# Patient Record
Sex: Female | Born: 1955 | Race: White | Hispanic: No
Health system: Southern US, Community
[De-identification: ages and names within clinical notes are randomized; demographics above are authoritative.]

## PROBLEM LIST (undated history)

## (undated) DIAGNOSIS — R002 Palpitations: Secondary | ICD-10-CM

## (undated) DIAGNOSIS — E785 Hyperlipidemia, unspecified: Secondary | ICD-10-CM

## (undated) DIAGNOSIS — I10 Essential (primary) hypertension: Secondary | ICD-10-CM

## (undated) HISTORY — PX: ENDOMETRIAL ABLATION: SHX621

## (undated) HISTORY — DX: Hyperlipidemia, unspecified: E78.5

## (undated) HISTORY — PX: OTHER SURGICAL HISTORY: SHX169

## (undated) HISTORY — DX: Essential (primary) hypertension: I10

## (undated) HISTORY — DX: Palpitations: R00.2

---

## 2005-01-12 ENCOUNTER — Other Ambulatory Visit: Admission: RE | Admit: 2005-01-12 | Discharge: 2005-01-12 | Payer: Self-pay | Admitting: Obstetrics and Gynecology

## 2011-02-16 ENCOUNTER — Other Ambulatory Visit: Payer: Self-pay | Admitting: Obstetrics and Gynecology

## 2011-02-16 DIAGNOSIS — N644 Mastodynia: Secondary | ICD-10-CM

## 2011-02-22 ENCOUNTER — Ambulatory Visit
Admission: RE | Admit: 2011-02-22 | Discharge: 2011-02-22 | Disposition: A | Discharge status: Home / Self Care | Payer: PRIVATE HEALTH INSURANCE | Source: Ambulatory Visit | Attending: Obstetrics and Gynecology | Admitting: Obstetrics and Gynecology

## 2011-02-22 DIAGNOSIS — N644 Mastodynia: Secondary | ICD-10-CM

## 2012-03-14 ENCOUNTER — Other Ambulatory Visit: Payer: Self-pay | Admitting: Obstetrics and Gynecology

## 2012-03-14 DIAGNOSIS — Z1231 Encounter for screening mammogram for malignant neoplasm of breast: Secondary | ICD-10-CM

## 2012-03-15 ENCOUNTER — Ambulatory Visit
Admission: RE | Admit: 2012-03-15 | Discharge: 2012-03-15 | Disposition: A | Discharge status: Home / Self Care | Payer: PRIVATE HEALTH INSURANCE | Source: Ambulatory Visit | Attending: Obstetrics and Gynecology | Admitting: Obstetrics and Gynecology

## 2012-03-15 DIAGNOSIS — Z1231 Encounter for screening mammogram for malignant neoplasm of breast: Secondary | ICD-10-CM

## 2012-03-20 ENCOUNTER — Ambulatory Visit: Payer: PRIVATE HEALTH INSURANCE

## 2012-04-02 ENCOUNTER — Ambulatory Visit: Payer: PRIVATE HEALTH INSURANCE | Admitting: Cardiology

## 2012-04-03 ENCOUNTER — Ambulatory Visit: Payer: PRIVATE HEALTH INSURANCE | Admitting: Cardiology

## 2012-04-07 DIAGNOSIS — I10 Essential (primary) hypertension: Secondary | ICD-10-CM

## 2012-04-08 DIAGNOSIS — R42 Dizziness and giddiness: Secondary | ICD-10-CM

## 2012-04-08 DIAGNOSIS — R079 Chest pain, unspecified: Secondary | ICD-10-CM

## 2012-04-17 ENCOUNTER — Encounter: Payer: Self-pay | Admitting: Physician Assistant

## 2012-04-17 ENCOUNTER — Ambulatory Visit (INDEPENDENT_AMBULATORY_CARE_PROVIDER_SITE_OTHER): Payer: PRIVATE HEALTH INSURANCE | Admitting: Physician Assistant

## 2012-04-17 VITALS — BP 123/82 | HR 80 | Ht 68.0 in | Wt 226.0 lb

## 2012-04-17 DIAGNOSIS — I10 Essential (primary) hypertension: Secondary | ICD-10-CM

## 2012-04-17 DIAGNOSIS — R002 Palpitations: Secondary | ICD-10-CM

## 2012-04-17 MED ORDER — LISINOPRIL 20 MG PO TABS: 20.0000 mg | ORAL_TABLET | Freq: Every morning | ORAL | Status: AC

## 2012-04-17 NOTE — Assessment & Plan Note (Addendum)
No further cardiac workup is indicated. Patient was just recently briefly hospitalized here at [facility], and seen in consultation by Dr. Antoine Poche for evaluation of atypical CP, tachycardia palpitations, and hypertensive urgency. She underwent extensive evaluation, notable for normal electrolytes, troponins, and TSH level. She had a negative GXT, and 2-D echocardiogram indicated normal LVF (EF 55-60%), with normal wall motion, and evidence of diastolic dysfunction. Moreover, she demonstrated no obvious dysrhythmia or ectopy on continuous monitoring, despite the frequent nature of these "fluttering" episodes. Therefore, I suspect that the etiology for these symptoms is most likely musculoskeletal in nature, i.e. muscle spasm. Continued monitoring on a outpatient basis would not yield any additional data, and the patient concurred with this recommendation. We will have the patient return to this clinic to followup with Dr. Antoine Poche, on an as-needed basis.

## 2012-04-17 NOTE — Patient Instructions (Addendum)
   Change Lisinopril to 20mg  every morning  Continue all other current medications.   Follow up with [facility] regarding blood pressure  Follow up as needed

## 2012-04-17 NOTE — Progress Notes (Signed)
Primary Cardiologist: Rollene Rotunda, MD   HPI: Patient presents for post hospital followup from [facility]. She was seen in consultation by Dr. Antoine Poche, on September 8, for evaluation of dizziness and hypertensive urgency.  She presented with no prior history of heart disease. She also complained of atypical chest pain, and ruled out for MI with normal troponins. She was further evaluated with a routine GXT and a 2-D echocardiogram, both within normal limits.  Patient also presented with complaint of palpitations, for which she was originally scheduled to see [place] here in the clinic on this date. She had no documented dysrhythmias during her brief stay. EKG indicated NSR with incomplete RBBB. TSH level was normal.  Clinically, she continues to experience these very brief (4-5 second) episodes of "fluttering", underlying her right breast. They are unpredictable in onset, and can occur every 5 minutes, or so. She has been experiencing these on a daily basis, since July. Interestingly, she notes no change in her pulse during these episodes, or the sensation that her heart is racing.  Patient did present with newly diagnosed HTN, and was discharged on ACE inhibitor with instructions to monitor her BP at home. These data were reviewed today, and notable for systolic readings generally in the 110-130 range, with an occasional 160 reading.   Current Outpatient Prescriptions  Medication Sig Dispense Refill  . Calcium Carbonate-Vitamin D (CALCIUM-D) 600-400 MG-UNIT TABS Take 1 tablet by mouth 2 (two) times daily.      Marland Kitchen lisinopril (PRINIVIL,ZESTRIL) 20 MG tablet Take 1 tablet (20 mg total) by mouth every morning.  30 tablet  3  . vitamin E 400 UNIT capsule Take 400 Units by mouth 2 (two) times daily.      Marland Kitchen DISCONTD: lisinopril (PRINIVIL,ZESTRIL) 10 MG tablet Take 10 mg by mouth 2 (two) times daily.        Past Medical History  Diagnosis Date  . HTN (hypertension)   . HLD (hyperlipidemia)   . Palpitations      Past Surgical History  Procedure Date  . Bilateral tubal ligation   . Cesarean section (aka c-section)   . Endometrial ablation     History   Social History  . Marital Status: Married    Spouse Name: N/A    Number of Children: N/A  . Years of Education: N/A   Occupational History  . Not on file.   Social History Main Topics  . Smoking status: Not on file  . Smokeless tobacco: Not on file  . Alcohol Use:   . Drug Use:   . Sexually Active:    Other Topics Concern  . Not on file   Social History Narrative  . No narrative on file    Family History  Problem Relation Age of Onset  . Heart disease Father     ROS: no nausea, vomiting; no fever, chills; no melena, hematochezia; no claudication  PHYSICAL EXAM: BP 123/82  Pulse 80  Ht 5\' 8"  (1.727 m)  Wt 226 lb (102.513 kg)  BMI 34.36 kg/m2  SpO2 97% GENERAL: 56 year old female, obese; NAD HEENT: NCAT, PERRLA, EOMI; sclera clear; no xanthelasma NECK: palpable bilateral carotid pulses, no bruits; no JVD; no TM LUNGS: CTA bilaterally CARDIAC: RRR (S1, S2); no significant murmurs; no rubs or gallops ABDOMEN: Protuberant EXTREMETIES: Trace bilateral peripheral edema SKIN: warm/dry; no obvious rash/lesions MUSCULOSKELETAL: no joint deformity NEURO: no focal deficit; NL affect   EKG:    ASSESSMENT & PLAN:  Palpitations No further cardiac workup is indicated.  Patient was just recently briefly hospitalized here at [facility], and seen in consultation by Dr. Antoine Poche for evaluation of atypical CP, tachycardia palpitations, and hypertensive urgency. She underwent extensive evaluation, notable for normal electrolytes, troponins, and TSH level. She had a negative GXT, and 2-D echocardiogram indicated normal LVF (EF 55-60%), with normal wall motion, and evidence of diastolic dysfunction. Moreover, she demonstrated no obvious dysrhythmia or ectopy on continuous monitoring, despite the frequent nature of these "fluttering"  episodes. Therefore, I suspect that the etiology for these symptoms is most likely musculoskeletal in nature, i.e. muscle spasm. Continued monitoring on a outpatient basis would not yield any additional data, and the patient concurred with this recommendation. We will have the patient return to this clinic to followup with Dr. Antoine Poche, on an as-needed basis.  HTN (hypertension) We'll consolidate lisinopril to 20 mg every morning, per patient request. Patient is to continue to monitor her BP closely in the ambulatory setting, and to resume followup with her primary care team in [place], [place].    Gene Shunte Senseney, PAC

## 2012-04-17 NOTE — Assessment & Plan Note (Signed)
We'll consolidate lisinopril to 20 mg every morning, per patient request. Patient is to continue to monitor her BP closely in the ambulatory setting, and to resume followup with her primary care team in [place], [place].

## 2013-05-30 ENCOUNTER — Other Ambulatory Visit: Payer: Self-pay

## 2013-05-30 DIAGNOSIS — Z1231 Encounter for screening mammogram for malignant neoplasm of breast: Secondary | ICD-10-CM

## 2013-06-25 ENCOUNTER — Ambulatory Visit
Admission: RE | Admit: 2013-06-25 | Discharge: 2013-06-25 | Disposition: A | Discharge status: Home / Self Care | Payer: PRIVATE HEALTH INSURANCE | Source: Ambulatory Visit

## 2013-06-25 DIAGNOSIS — Z1231 Encounter for screening mammogram for malignant neoplasm of breast: Secondary | ICD-10-CM

## 2014-08-13 ENCOUNTER — Other Ambulatory Visit: Payer: Self-pay

## 2014-08-13 DIAGNOSIS — Z1231 Encounter for screening mammogram for malignant neoplasm of breast: Secondary | ICD-10-CM

## 2014-08-18 ENCOUNTER — Ambulatory Visit
Admission: RE | Admit: 2014-08-18 | Discharge: 2014-08-18 | Disposition: A | Discharge status: Home / Self Care | Payer: BC Managed Care – PPO | Source: Ambulatory Visit

## 2014-08-18 DIAGNOSIS — Z1231 Encounter for screening mammogram for malignant neoplasm of breast: Secondary | ICD-10-CM

## 2015-06-03 ENCOUNTER — Encounter (INDEPENDENT_AMBULATORY_CARE_PROVIDER_SITE_OTHER): Payer: Self-pay | Admitting: *Deleted

## 2015-07-07 ENCOUNTER — Ambulatory Visit (INDEPENDENT_AMBULATORY_CARE_PROVIDER_SITE_OTHER): Payer: BC Managed Care – PPO | Admitting: Internal Medicine

## 2016-03-13 ENCOUNTER — Other Ambulatory Visit: Payer: Self-pay | Admitting: Obstetrics and Gynecology

## 2016-03-13 DIAGNOSIS — Z1231 Encounter for screening mammogram for malignant neoplasm of breast: Secondary | ICD-10-CM

## 2016-03-24 ENCOUNTER — Ambulatory Visit
Admission: RE | Admit: 2016-03-24 | Discharge: 2016-03-24 | Disposition: A | Discharge status: Home / Self Care | Payer: BC Managed Care – PPO | Source: Ambulatory Visit | Attending: Obstetrics and Gynecology | Admitting: Obstetrics and Gynecology

## 2016-03-24 DIAGNOSIS — Z1231 Encounter for screening mammogram for malignant neoplasm of breast: Secondary | ICD-10-CM

## 2017-06-05 ENCOUNTER — Other Ambulatory Visit: Payer: Self-pay | Admitting: Radiology

## 2017-06-05 ENCOUNTER — Ambulatory Visit
Admission: RE | Admit: 2017-06-05 | Discharge: 2017-06-05 | Disposition: A | Discharge status: Home / Self Care | Payer: BC Managed Care – PPO | Source: Ambulatory Visit | Attending: Radiology | Admitting: Radiology

## 2017-06-05 DIAGNOSIS — Z1231 Encounter for screening mammogram for malignant neoplasm of breast: Secondary | ICD-10-CM

## 2017-07-31 HISTORY — PX: BREAST BIOPSY: SHX20

## 2018-05-02 ENCOUNTER — Other Ambulatory Visit: Payer: Self-pay | Admitting: Radiology

## 2018-05-02 ENCOUNTER — Other Ambulatory Visit: Payer: Self-pay | Admitting: Obstetrics and Gynecology

## 2018-05-02 DIAGNOSIS — Z1231 Encounter for screening mammogram for malignant neoplasm of breast: Secondary | ICD-10-CM

## 2018-06-06 ENCOUNTER — Ambulatory Visit
Admission: RE | Admit: 2018-06-06 | Discharge: 2018-06-06 | Disposition: A | Discharge status: Home / Self Care | Payer: BC Managed Care – PPO | Source: Ambulatory Visit | Attending: Obstetrics and Gynecology | Admitting: Obstetrics and Gynecology

## 2018-06-06 DIAGNOSIS — Z1231 Encounter for screening mammogram for malignant neoplasm of breast: Secondary | ICD-10-CM

## 2018-06-10 ENCOUNTER — Other Ambulatory Visit: Payer: Self-pay | Admitting: Obstetrics and Gynecology

## 2018-06-10 DIAGNOSIS — R928 Other abnormal and inconclusive findings on diagnostic imaging of breast: Secondary | ICD-10-CM

## 2018-06-11 ENCOUNTER — Ambulatory Visit
Admission: RE | Admit: 2018-06-11 | Discharge: 2018-06-11 | Disposition: A | Discharge status: Home / Self Care | Payer: BC Managed Care – PPO | Source: Ambulatory Visit | Attending: Obstetrics and Gynecology | Admitting: Obstetrics and Gynecology

## 2018-06-11 ENCOUNTER — Other Ambulatory Visit: Payer: Self-pay | Admitting: Obstetrics and Gynecology

## 2018-06-11 DIAGNOSIS — R928 Other abnormal and inconclusive findings on diagnostic imaging of breast: Secondary | ICD-10-CM

## 2018-06-11 DIAGNOSIS — N6489 Other specified disorders of breast: Secondary | ICD-10-CM

## 2018-06-12 ENCOUNTER — Ambulatory Visit
Admission: RE | Admit: 2018-06-12 | Discharge: 2018-06-12 | Disposition: A | Discharge status: Home / Self Care | Payer: BC Managed Care – PPO | Source: Ambulatory Visit | Attending: Obstetrics and Gynecology | Admitting: Obstetrics and Gynecology

## 2018-06-12 DIAGNOSIS — N6489 Other specified disorders of breast: Secondary | ICD-10-CM

## 2018-11-26 ENCOUNTER — Other Ambulatory Visit: Payer: Self-pay | Admitting: Obstetrics and Gynecology

## 2018-11-26 DIAGNOSIS — N6489 Other specified disorders of breast: Secondary | ICD-10-CM

## 2018-12-12 ENCOUNTER — Other Ambulatory Visit: Payer: Self-pay

## 2018-12-12 ENCOUNTER — Ambulatory Visit: Payer: BC Managed Care – PPO

## 2018-12-12 ENCOUNTER — Ambulatory Visit
Admission: RE | Admit: 2018-12-12 | Discharge: 2018-12-12 | Disposition: A | Discharge status: Home / Self Care | Payer: BC Managed Care – PPO | Source: Ambulatory Visit | Attending: Obstetrics and Gynecology | Admitting: Obstetrics and Gynecology

## 2018-12-12 DIAGNOSIS — N6489 Other specified disorders of breast: Secondary | ICD-10-CM

## 2019-05-07 ENCOUNTER — Other Ambulatory Visit: Payer: Self-pay | Admitting: Obstetrics and Gynecology

## 2019-05-07 DIAGNOSIS — Z1231 Encounter for screening mammogram for malignant neoplasm of breast: Secondary | ICD-10-CM

## 2019-06-18 ENCOUNTER — Other Ambulatory Visit: Payer: Self-pay

## 2019-06-18 DIAGNOSIS — Z20822 Contact with and (suspected) exposure to covid-19: Secondary | ICD-10-CM

## 2019-06-19 ENCOUNTER — Ambulatory Visit: Payer: BC Managed Care – PPO

## 2019-06-20 ENCOUNTER — Ambulatory Visit: Payer: Self-pay

## 2019-06-20 LAB — NOVEL CORONAVIRUS, NAA: SARS-CoV-2, NAA: NOT DETECTED

## 2019-06-20 NOTE — Telephone Encounter (Signed)
Provided covid-19 testing.  Results to Patient .  Voiced understanding.

## 2019-08-08 ENCOUNTER — Ambulatory Visit
Admission: RE | Admit: 2019-08-08 | Discharge: 2019-08-08 | Disposition: A | Discharge status: Home / Self Care | Payer: BC Managed Care – PPO | Source: Ambulatory Visit | Attending: Obstetrics and Gynecology | Admitting: Obstetrics and Gynecology

## 2019-08-08 ENCOUNTER — Other Ambulatory Visit: Payer: Self-pay

## 2019-08-08 DIAGNOSIS — Z1231 Encounter for screening mammogram for malignant neoplasm of breast: Secondary | ICD-10-CM

## 2020-09-06 ENCOUNTER — Other Ambulatory Visit: Payer: Self-pay | Admitting: Obstetrics and Gynecology

## 2020-09-06 DIAGNOSIS — Z1231 Encounter for screening mammogram for malignant neoplasm of breast: Secondary | ICD-10-CM

## 2020-09-07 ENCOUNTER — Other Ambulatory Visit: Payer: Self-pay

## 2020-09-07 ENCOUNTER — Ambulatory Visit
Admission: RE | Admit: 2020-09-07 | Discharge: 2020-09-07 | Disposition: A | Discharge status: Home / Self Care | Payer: Medicare Other | Source: Ambulatory Visit | Attending: Obstetrics and Gynecology | Admitting: Obstetrics and Gynecology

## 2020-09-07 DIAGNOSIS — Z1231 Encounter for screening mammogram for malignant neoplasm of breast: Secondary | ICD-10-CM

## 2021-10-17 ENCOUNTER — Other Ambulatory Visit: Payer: Self-pay | Admitting: Obstetrics and Gynecology

## 2021-10-17 DIAGNOSIS — Z1231 Encounter for screening mammogram for malignant neoplasm of breast: Secondary | ICD-10-CM

## 2021-11-02 ENCOUNTER — Ambulatory Visit
Admission: RE | Admit: 2021-11-02 | Discharge: 2021-11-02 | Disposition: A | Discharge status: Home / Self Care | Payer: Medicare PPO | Source: Ambulatory Visit | Attending: Obstetrics and Gynecology | Admitting: Obstetrics and Gynecology

## 2021-11-02 DIAGNOSIS — Z1231 Encounter for screening mammogram for malignant neoplasm of breast: Secondary | ICD-10-CM

## 2022-02-09 ENCOUNTER — Other Ambulatory Visit: Payer: Self-pay | Admitting: Obstetrics and Gynecology

## 2022-02-09 DIAGNOSIS — Z1239 Encounter for other screening for malignant neoplasm of breast: Secondary | ICD-10-CM

## 2022-05-02 ENCOUNTER — Ambulatory Visit
Admission: RE | Admit: 2022-05-02 | Discharge: 2022-05-02 | Disposition: A | Discharge status: Home / Self Care | Payer: Medicare PPO | Source: Ambulatory Visit | Attending: Obstetrics and Gynecology | Admitting: Obstetrics and Gynecology

## 2022-05-02 DIAGNOSIS — Z1239 Encounter for other screening for malignant neoplasm of breast: Secondary | ICD-10-CM

## 2022-05-02 MED ORDER — GADOPICLENOL 0.5 MMOL/ML IV SOLN
10.0000 mL | Freq: Once | INTRAVENOUS | Status: AC | PRN
  Administered 2022-05-02: 10 mL via INTRAVENOUS

## 2022-11-08 IMAGING — MG MM DIGITAL SCREENING BILAT W/ TOMO AND CAD
6 of 11 series · 6 of 31 positions shown · non-contrast
Comparison: Previous exam(s).

CLINICAL DATA: Screening.

EXAM:
DIGITAL SCREENING BILATERAL MAMMOGRAM WITH TOMOSYNTHESIS AND CAD
TECHNIQUE: Bilateral screening digital craniocaudal and mediolateral oblique
mammograms were obtained. Bilateral screening digital breast
tomosynthesis was performed. The images were evaluated with
computer-aided detection.

[R MLO synth-2D]
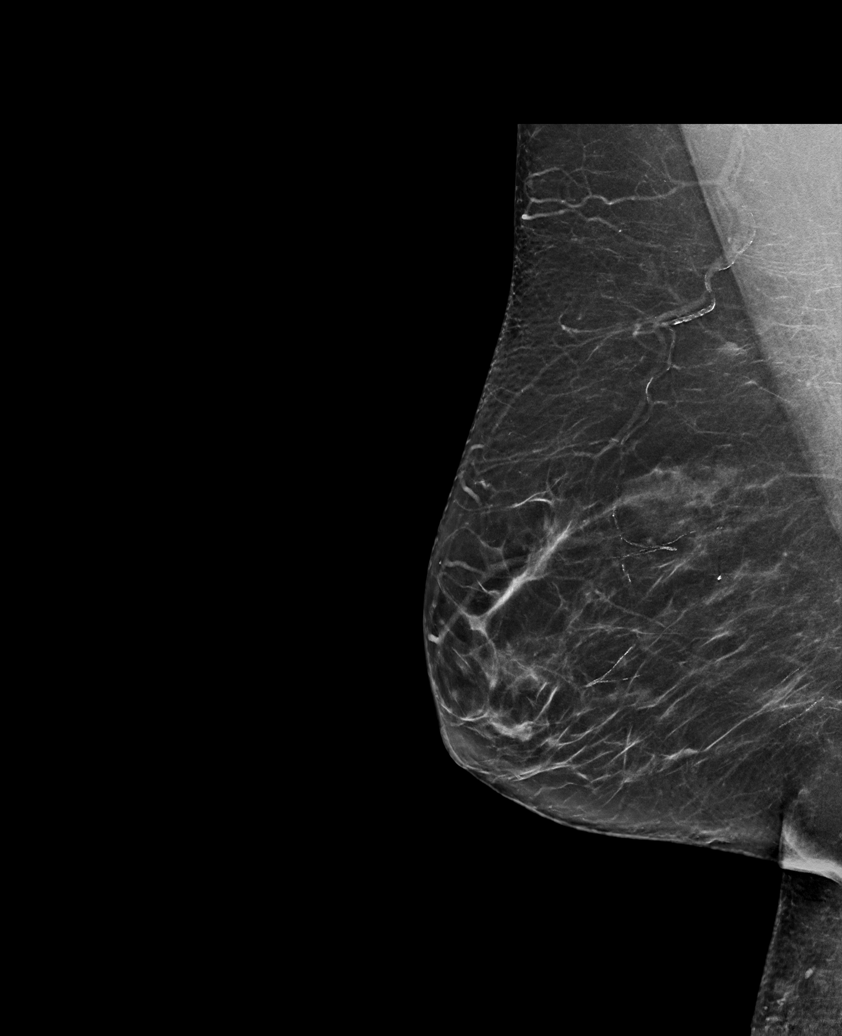

[R CC synth-2D]
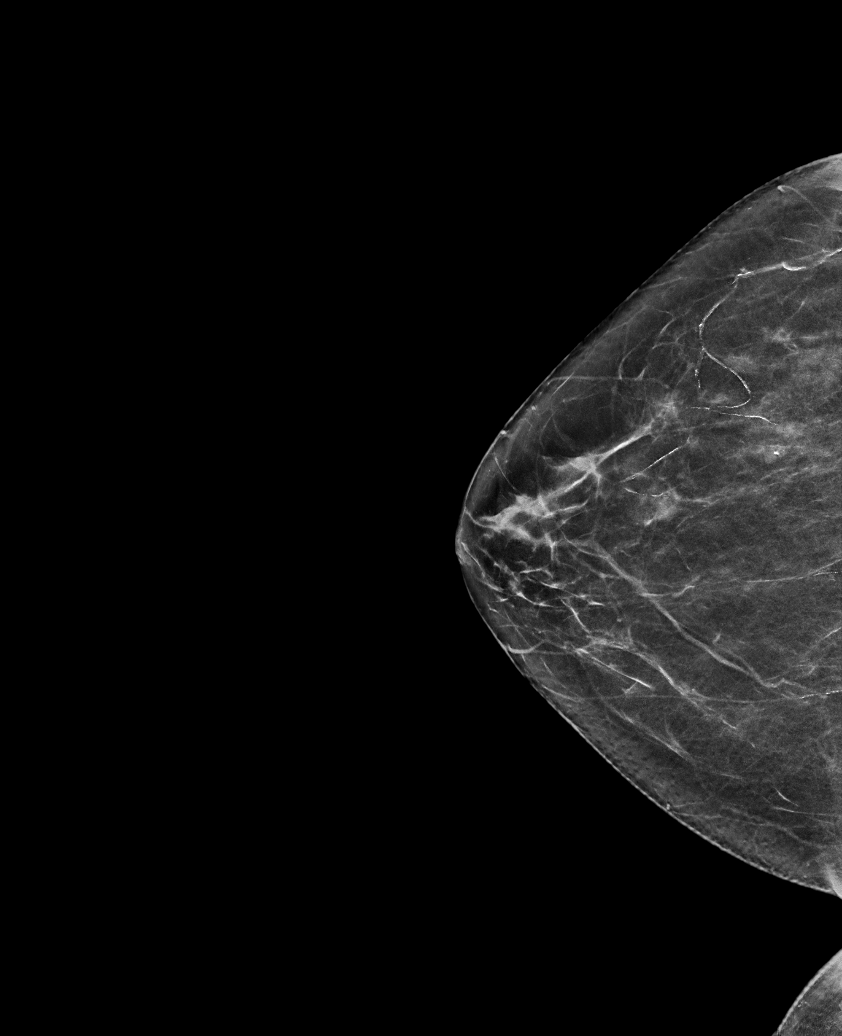

[L MLO synth-2D (1 of 2)]
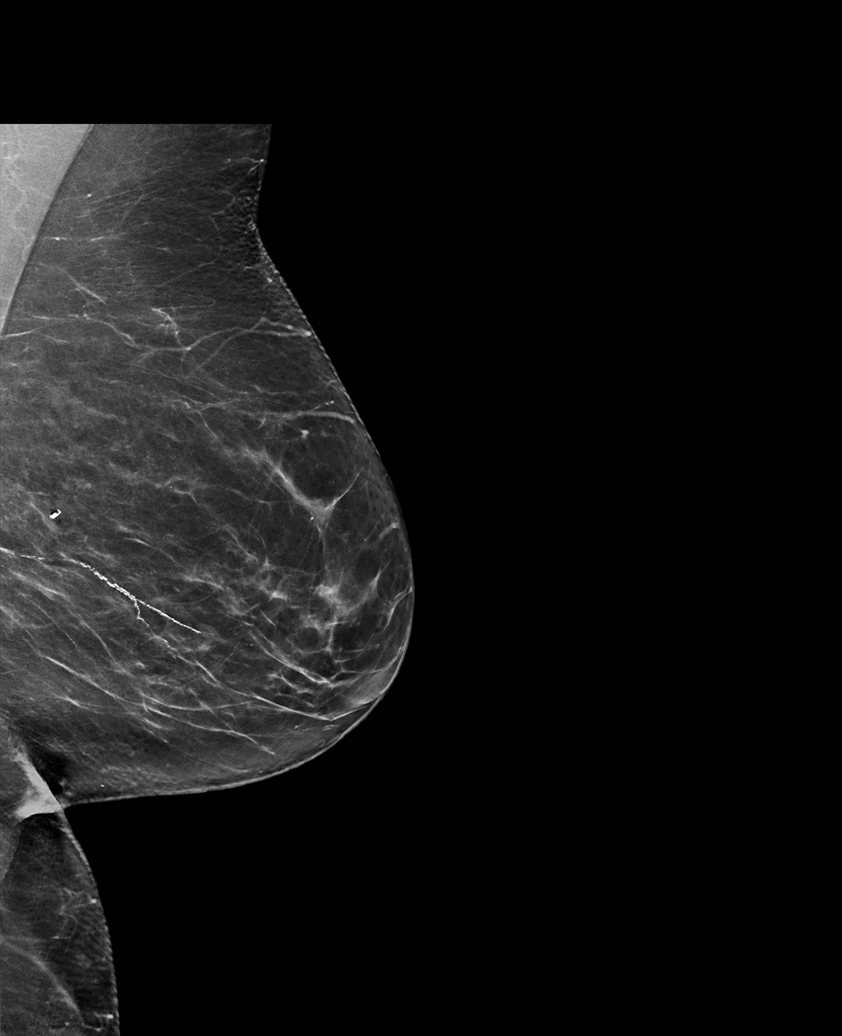

[L MLO synth-2D (2 of 2)]
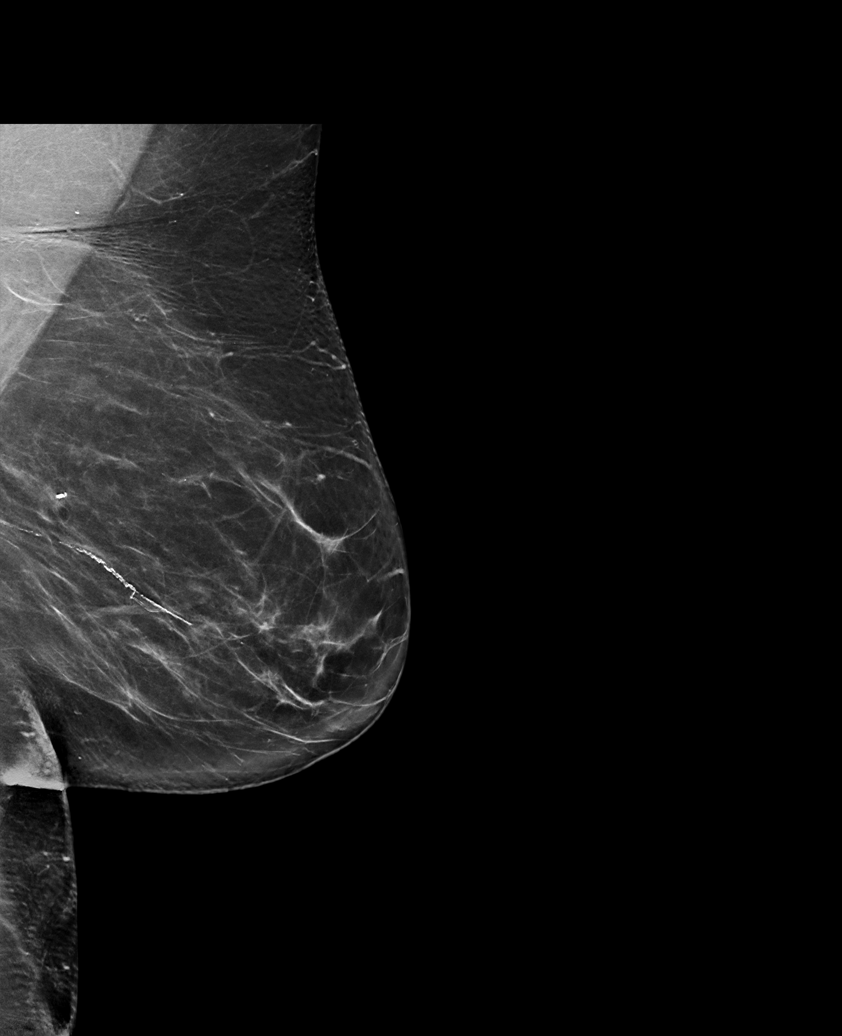

[L CC synth-2D]
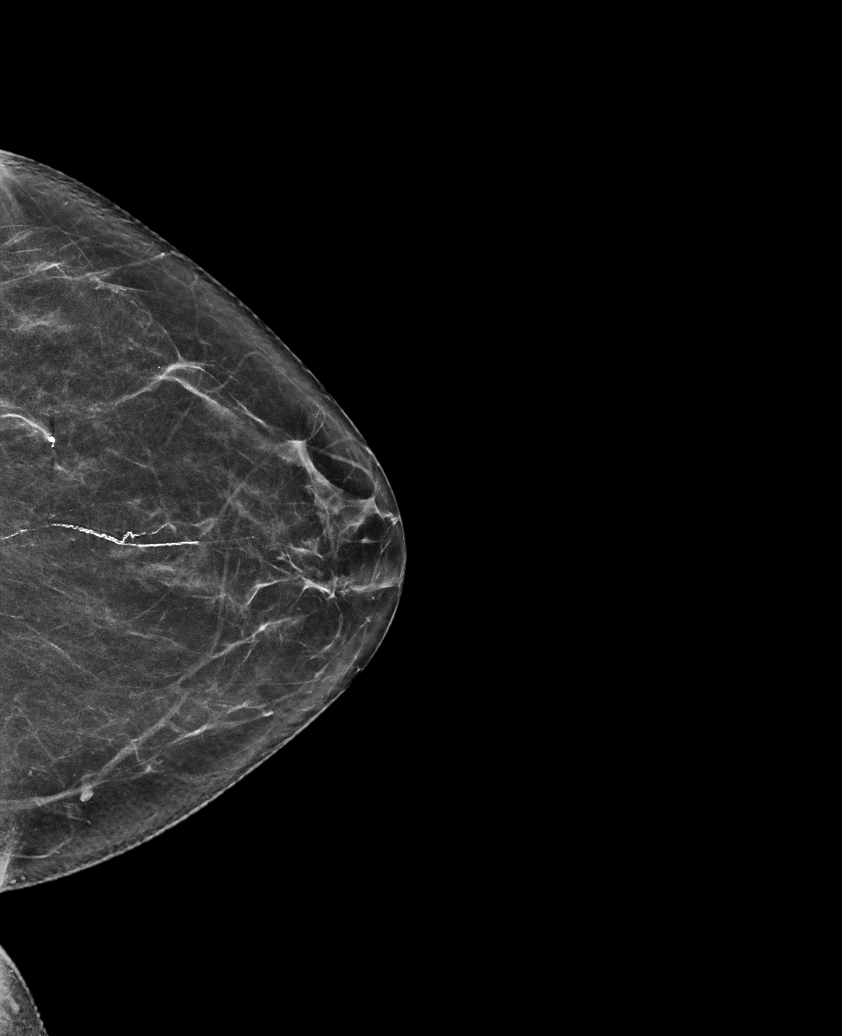

[L MLO tomo]
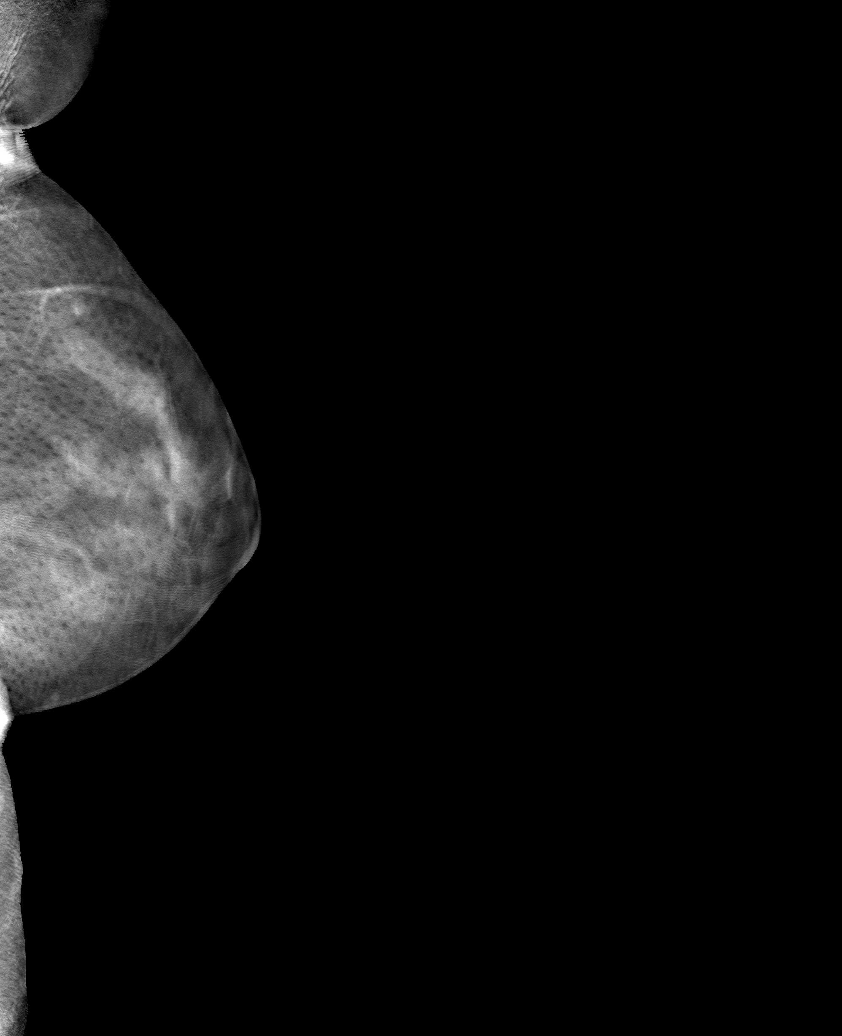

[6 of 31 positions shown; findings below may reference images not displayed]

ACR Breast Density Category b: There are scattered areas of
fibroglandular density.
FINDINGS: There are no findings suspicious for malignancy.
IMPRESSION: No mammographic evidence of malignancy. A result letter of this
screening mammogram will be mailed directly to the patient.

RECOMMENDATION:
Screening mammogram in one year. (Code:51-O-LD2)

BI-RADS CATEGORY  1: Negative.

## 2023-02-08 DIAGNOSIS — H43822 Vitreomacular adhesion, left eye: Secondary | ICD-10-CM | POA: Diagnosis not present

## 2023-02-08 DIAGNOSIS — H26493 Other secondary cataract, bilateral: Secondary | ICD-10-CM | POA: Diagnosis not present

## 2023-02-08 DIAGNOSIS — H35341 Macular cyst, hole, or pseudohole, right eye: Secondary | ICD-10-CM | POA: Diagnosis not present

## 2023-03-07 ENCOUNTER — Other Ambulatory Visit: Payer: Self-pay | Admitting: Obstetrics and Gynecology

## 2023-03-07 DIAGNOSIS — Z1231 Encounter for screening mammogram for malignant neoplasm of breast: Secondary | ICD-10-CM

## 2023-03-07 DIAGNOSIS — I10 Essential (primary) hypertension: Secondary | ICD-10-CM | POA: Diagnosis not present

## 2023-03-13 ENCOUNTER — Ambulatory Visit
Admission: RE | Admit: 2023-03-13 | Discharge: 2023-03-13 | Disposition: A | Discharge status: Home / Self Care | Payer: Medicare PPO | Source: Ambulatory Visit | Attending: Obstetrics and Gynecology | Admitting: Obstetrics and Gynecology

## 2023-03-13 DIAGNOSIS — Z1231 Encounter for screening mammogram for malignant neoplasm of breast: Secondary | ICD-10-CM

## 2023-03-15 ENCOUNTER — Other Ambulatory Visit: Payer: Self-pay | Admitting: Obstetrics and Gynecology

## 2023-03-15 DIAGNOSIS — R928 Other abnormal and inconclusive findings on diagnostic imaging of breast: Secondary | ICD-10-CM

## 2023-03-21 DIAGNOSIS — H26493 Other secondary cataract, bilateral: Secondary | ICD-10-CM | POA: Diagnosis not present

## 2023-03-21 DIAGNOSIS — H35371 Puckering of macula, right eye: Secondary | ICD-10-CM | POA: Diagnosis not present

## 2023-03-21 DIAGNOSIS — Z13820 Encounter for screening for osteoporosis: Secondary | ICD-10-CM | POA: Diagnosis not present

## 2023-03-21 DIAGNOSIS — M8588 Other specified disorders of bone density and structure, other site: Secondary | ICD-10-CM | POA: Diagnosis not present

## 2023-03-21 DIAGNOSIS — Z1151 Encounter for screening for human papillomavirus (HPV): Secondary | ICD-10-CM | POA: Diagnosis not present

## 2023-03-21 DIAGNOSIS — Z6833 Body mass index (BMI) 33.0-33.9, adult: Secondary | ICD-10-CM | POA: Diagnosis not present

## 2023-03-21 DIAGNOSIS — H43822 Vitreomacular adhesion, left eye: Secondary | ICD-10-CM | POA: Diagnosis not present

## 2023-03-21 DIAGNOSIS — Z961 Presence of intraocular lens: Secondary | ICD-10-CM | POA: Diagnosis not present

## 2023-03-21 DIAGNOSIS — H35341 Macular cyst, hole, or pseudohole, right eye: Secondary | ICD-10-CM | POA: Diagnosis not present

## 2023-03-21 DIAGNOSIS — N958 Other specified menopausal and perimenopausal disorders: Secondary | ICD-10-CM | POA: Diagnosis not present

## 2023-03-21 DIAGNOSIS — Z124 Encounter for screening for malignant neoplasm of cervix: Secondary | ICD-10-CM | POA: Diagnosis not present

## 2023-03-27 ENCOUNTER — Other Ambulatory Visit: Payer: Self-pay | Admitting: Obstetrics and Gynecology

## 2023-03-27 ENCOUNTER — Ambulatory Visit
Admission: RE | Admit: 2023-03-27 | Discharge: 2023-03-27 | Disposition: A | Discharge status: Home / Self Care | Payer: Medicare PPO | Source: Ambulatory Visit | Attending: Obstetrics and Gynecology | Admitting: Obstetrics and Gynecology

## 2023-03-27 DIAGNOSIS — R928 Other abnormal and inconclusive findings on diagnostic imaging of breast: Secondary | ICD-10-CM | POA: Diagnosis not present

## 2023-03-27 DIAGNOSIS — N6489 Other specified disorders of breast: Secondary | ICD-10-CM

## 2023-03-29 ENCOUNTER — Ambulatory Visit
Admission: RE | Admit: 2023-03-29 | Discharge: 2023-03-29 | Disposition: A | Discharge status: Home / Self Care | Payer: Medicare PPO | Source: Ambulatory Visit | Attending: Obstetrics and Gynecology | Admitting: Obstetrics and Gynecology

## 2023-03-29 DIAGNOSIS — N6489 Other specified disorders of breast: Secondary | ICD-10-CM

## 2023-03-29 DIAGNOSIS — R928 Other abnormal and inconclusive findings on diagnostic imaging of breast: Secondary | ICD-10-CM

## 2023-03-29 DIAGNOSIS — N6012 Diffuse cystic mastopathy of left breast: Secondary | ICD-10-CM | POA: Diagnosis not present

## 2023-03-29 HISTORY — PX: BREAST BIOPSY: SHX20

## 2023-04-11 DIAGNOSIS — D225 Melanocytic nevi of trunk: Secondary | ICD-10-CM | POA: Diagnosis not present

## 2023-04-11 DIAGNOSIS — L918 Other hypertrophic disorders of the skin: Secondary | ICD-10-CM | POA: Diagnosis not present

## 2023-04-11 DIAGNOSIS — C44629 Squamous cell carcinoma of skin of left upper limb, including shoulder: Secondary | ICD-10-CM | POA: Diagnosis not present

## 2023-04-11 DIAGNOSIS — D2272 Melanocytic nevi of left lower limb, including hip: Secondary | ICD-10-CM | POA: Diagnosis not present

## 2023-04-11 DIAGNOSIS — Z08 Encounter for follow-up examination after completed treatment for malignant neoplasm: Secondary | ICD-10-CM | POA: Diagnosis not present

## 2023-04-11 DIAGNOSIS — D2271 Melanocytic nevi of right lower limb, including hip: Secondary | ICD-10-CM | POA: Diagnosis not present

## 2023-04-11 DIAGNOSIS — L814 Other melanin hyperpigmentation: Secondary | ICD-10-CM | POA: Diagnosis not present

## 2023-04-11 DIAGNOSIS — L853 Xerosis cutis: Secondary | ICD-10-CM | POA: Diagnosis not present

## 2023-04-11 DIAGNOSIS — Z85828 Personal history of other malignant neoplasm of skin: Secondary | ICD-10-CM | POA: Diagnosis not present

## 2023-04-11 DIAGNOSIS — L821 Other seborrheic keratosis: Secondary | ICD-10-CM | POA: Diagnosis not present

## 2023-04-11 DIAGNOSIS — C44319 Basal cell carcinoma of skin of other parts of face: Secondary | ICD-10-CM | POA: Diagnosis not present

## 2023-04-18 DIAGNOSIS — H26492 Other secondary cataract, left eye: Secondary | ICD-10-CM | POA: Diagnosis not present

## 2023-06-19 DIAGNOSIS — C44319 Basal cell carcinoma of skin of other parts of face: Secondary | ICD-10-CM | POA: Diagnosis not present

## 2023-09-10 DIAGNOSIS — D0462 Carcinoma in situ of skin of left upper limb, including shoulder: Secondary | ICD-10-CM | POA: Diagnosis not present

## 2023-10-30 DIAGNOSIS — L923 Foreign body granuloma of the skin and subcutaneous tissue: Secondary | ICD-10-CM | POA: Diagnosis not present

## 2024-03-03 DIAGNOSIS — R490 Dysphonia: Secondary | ICD-10-CM | POA: Diagnosis not present

## 2024-03-03 DIAGNOSIS — N390 Urinary tract infection, site not specified: Secondary | ICD-10-CM | POA: Diagnosis not present

## 2024-03-03 DIAGNOSIS — Z299 Encounter for prophylactic measures, unspecified: Secondary | ICD-10-CM | POA: Diagnosis not present

## 2024-03-03 DIAGNOSIS — I10 Essential (primary) hypertension: Secondary | ICD-10-CM | POA: Diagnosis not present

## 2024-03-17 DIAGNOSIS — G2581 Restless legs syndrome: Secondary | ICD-10-CM | POA: Diagnosis not present

## 2024-03-17 DIAGNOSIS — I10 Essential (primary) hypertension: Secondary | ICD-10-CM | POA: Diagnosis not present

## 2024-03-17 DIAGNOSIS — R609 Edema, unspecified: Secondary | ICD-10-CM | POA: Diagnosis not present

## 2024-03-17 DIAGNOSIS — Z299 Encounter for prophylactic measures, unspecified: Secondary | ICD-10-CM | POA: Diagnosis not present

## 2024-03-17 DIAGNOSIS — N390 Urinary tract infection, site not specified: Secondary | ICD-10-CM | POA: Diagnosis not present

## 2024-04-09 DIAGNOSIS — D559 Anemia due to enzyme disorder, unspecified: Secondary | ICD-10-CM | POA: Diagnosis not present

## 2024-04-09 DIAGNOSIS — Z1329 Encounter for screening for other suspected endocrine disorder: Secondary | ICD-10-CM | POA: Diagnosis not present

## 2024-04-09 DIAGNOSIS — Z0001 Encounter for general adult medical examination with abnormal findings: Secondary | ICD-10-CM | POA: Diagnosis not present

## 2024-04-09 DIAGNOSIS — Z13220 Encounter for screening for lipoid disorders: Secondary | ICD-10-CM | POA: Diagnosis not present

## 2024-04-09 DIAGNOSIS — R609 Edema, unspecified: Secondary | ICD-10-CM | POA: Diagnosis not present

## 2024-04-09 DIAGNOSIS — Z1321 Encounter for screening for nutritional disorder: Secondary | ICD-10-CM | POA: Diagnosis not present

## 2024-04-09 DIAGNOSIS — I10 Essential (primary) hypertension: Secondary | ICD-10-CM | POA: Diagnosis not present

## 2024-04-09 DIAGNOSIS — Z6832 Body mass index (BMI) 32.0-32.9, adult: Secondary | ICD-10-CM | POA: Diagnosis not present
